# Patient Record
Sex: Male | Born: 1995 | Hispanic: Yes | Marital: Single | Smoking: Current some day smoker
Health system: Southern US, Community
[De-identification: ages and names within clinical notes are randomized; demographics above are authoritative.]

---
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
## 2016-05-30 ENCOUNTER — Emergency Department
Admission: EM | Admit: 2016-05-30 | Discharge: 2016-05-30 | Disposition: A | Discharge status: Home / Self Care | Payer: Self-pay | Attending: Emergency Medicine | Admitting: Emergency Medicine

## 2016-05-30 ENCOUNTER — Encounter: Payer: Self-pay | Admitting: *Deleted

## 2016-05-30 DIAGNOSIS — F17200 Nicotine dependence, unspecified, uncomplicated: Secondary | ICD-10-CM | POA: Insufficient documentation

## 2016-05-30 DIAGNOSIS — K047 Periapical abscess without sinus: Secondary | ICD-10-CM | POA: Insufficient documentation

## 2016-05-30 MED ORDER — NAPROXEN 500 MG PO TABS: 500.0000 mg | ORAL_TABLET | Freq: Two times a day (BID) | ORAL | 0 refills | Status: DC

## 2016-05-30 MED ORDER — HYDROCODONE-ACETAMINOPHEN 5-325 MG PO TABS: 1.0000 | ORAL_TABLET | ORAL | 0 refills | Status: DC | PRN

## 2016-05-30 MED ORDER — AMOXICILLIN 500 MG PO TABS: 500.0000 mg | ORAL_TABLET | Freq: Two times a day (BID) | ORAL | 0 refills | Status: DC

## 2016-05-30 NOTE — ED Notes (Signed)
NAD noted at time of D/C. Pt denies questions or concerns. Pt ambulatory to the lobby at this time.  

## 2016-05-30 NOTE — ED Provider Notes (Signed)
[facility] Emergency Department Provider Note ____________________________________________  Time seen: Approximately 4:26 PM  I have reviewed the triage vital signs and the nursing notes.   HISTORY  Chief Complaint Dental Pain   HPI Willie Lyons is a 20 y.o. male resents to the emergency department for evaluation of dental pain. Pain started yesterday and swelling happened overnight. Swelling has continued to increase throughout the day. He has taken ibuprofen without relief.  History reviewed. No pertinent past medical history.  There are no active problems to display for this patient.   History reviewed. No pertinent surgical history.  Prior to Admission medications   Medication Sig Start Date End Date Taking? Authorizing Provider  amoxicillin (AMOXIL) 500 MG tablet Take 1 tablet (500 mg total) by mouth 2 (two) times daily. 05/30/16   Chinita Pester, FNP  HYDROcodone-acetaminophen (NORCO/VICODIN) 5-325 MG tablet Take 1 tablet by mouth every 4 (four) hours as needed for moderate pain. 05/30/16 05/30/17  Chinita Pester, FNP  naproxen (NAPROSYN) 500 MG tablet Take 1 tablet (500 mg total) by mouth 2 (two) times daily with a meal. 05/30/16   Chinita Pester, FNP    Allergies Review of patient's allergies indicates no known allergies.  No family history on file.  Social History Social History  Substance Use Topics  . Smoking status: Current Some Day Smoker  . Smokeless tobacco: Not on file  . Alcohol use Yes    Review of Systems Constitutional: Well appearing. Negative for fever ENT: Negative for eye pain, negative for rhinorrhea or congestion, negative for sore throat. Musculoskeletal: Negative for jaw pain/trismus. Skin: Positive for her mild edema, negative for erythema ____________________________________________   PHYSICAL EXAM:  VITAL SIGNS: ED Triage Vitals  Enc Vitals Group     BP 05/30/16 1609 124/81     Pulse Rate 05/30/16 1609  82     Resp 05/30/16 1609 18     Temp 05/30/16 1609 98.3 F (36.8 C)     Temp Source 05/30/16 1609 Oral     SpO2 05/30/16 1609 98 %     Weight 05/30/16 1608 140 lb (63.5 kg)     Height 05/30/16 1608 6\' 2"  (1.88 m)     Head Circumference --      Peak Flow --      Pain Score 05/30/16 1609 10     Pain Loc --      Pain Edu? --      Excl. in GC? --     Constitutional: Alert and oriented. Well appearing and in no acute distress. Eyes: Conjunctivae are normal.EOMI. Mouth/Throat: Mucous membranes are moist. Oropharynx non-erythematous. Mild edema of the right mandible without fluctuance. Periodontal Exam    Hematological/Lymphatic/Immunilogical: No cervical lymphadenopathy. Respiratory: Normal respiratory effort.  Musculoskeletal: Full ROM x 4 extremities. Neurologic:  Normal speech and language. No gross focal neurologic deficits are appreciated. Speech is normal. No gait instability. Skin:  Negative for erythema Psychiatric: Mood and affect are normal. Speech and behavior are normal.  ____________________________________________   LABS (all labs ordered are listed, but only abnormal results are displayed)  Labs Reviewed - No data to display ____________________________________________   RADIOLOGY  Not indicated ____________________________________________   PROCEDURES  Procedure(s) performed: None  Critical Care performed: No  ____________________________________________   INITIAL IMPRESSION / ASSESSMENT AND PLAN / ED COURSE  Pertinent labs & imaging results that were available during my care of the patient were reviewed by me and considered in my medical decision making (  see chart for details). Patient will be prescribed amoxicillin, Naprosyn, and 3 tablets of Norco 5/325. Patient was advised to see the dentist within 14 days. Also advised to take the antibiotic until finished. Instructed to return to the ER for symptoms that change or worsen if unable to schedule  an appointment. ____________________________________________   FINAL CLINICAL IMPRESSION(S) / ED DIAGNOSES  Final diagnoses:  Dental abscess    Note:  This document was prepared using Dragon voice recognition software and may include unintentional dictation errors.    Chinita PesterCari B Guled Gahan, FNP 05/30/16 1754    Sharyn CreamerMark Quale, MD 06/05/16 [phone]

## 2016-05-30 NOTE — ED Triage Notes (Signed)
Pt complains of dental pain starting last night

## 2016-05-30 NOTE — Discharge Instructions (Signed)
Please call and schedule a dental appointment as soon as possible. You will need to be seen within the next 14 days. Return to the emergency department for symptoms that change or worsen if you're unable to schedule an appointment.  OPTIONS FOR DENTAL FOLLOW UP CARE  [place] Department of Health and Human Services - Local Safety Net Dental Clinics http://www.ncdhhs.gov/dph/oralhealth/services/safetynetclinics.htm   [facility] ([phone])  [facility] ([phone])  [facility] ([phone])  [facility] ([phone])  [facility] ([phone]) This clinic caters to the indigent population and is on a lottery system. Location: [facility], [address], [place] Clinic Hours: Wednesdays from 6pm - 9pm, patients seen by a lottery system. For dates, call or go to [email or web address].[facility].edu/[facility]/patients/[email or web address]-[facility] Services: Cleanings, fillings and simple extractions. Payment Options: DENTAL WORK IS FREE OF CHARGE. Bring proof of income or support. Best way to get seen: Arrive at 5:15 pm - this is a lottery, NOT first come/first serve, so arriving earlier will not increase your chances of being seen.     [facility] [phone] Select option 1 for emergencies   Location: [facility], [address], [place] Clinic Hours: No walk-ins accepted - call the day before to schedule an appointment. Check in times are 9:30 am and 1:30 pm. Services: Simple extractions, temporary fillings, pulpectomy/pulp debridement, uncomplicated abscess drainage. Payment Options: PAYMENT IS DUE AT THE TIME OF SERVICE.  Fee is usually $100-200, additional surgical procedures (e.g. abscess drainage) may be extra. Cash, checks, Visa/MasterCard accepted.  Can file Medicaid if patient is covered for dental - patient should call case worker to check. No  discount for [facility] patients. Best way to get seen: MUST call the day before and get onto the schedule. Can usually be seen the next 1-2 days. No walk-ins accepted.     [facility] [phone]   Location: [facility], [address], [place] [facility] Hours: M, W, Th, F 8am or 1:30pm, Tues 9a or 1:30 - first come/first served. Services: Simple extractions, temporary fillings, uncomplicated abscess drainage.  You do not need to be an [place] resident. Payment Options: PAYMENT IS DUE AT THE TIME OF SERVICE. Dental insurance, otherwise sliding scale - bring proof of income or support. Depending on income and treatment needed, cost is usually $50-200. Best way to get seen: Arrive early as it is first come/first served.     [facility] [phone]   Location: [address] Clinic Hours: Mon-Thu 8a-5p Services: Most basic dental services including extractions and fillings. Payment Options: PAYMENT IS DUE AT THE TIME OF SERVICE. Sliding scale, up to 50% off - bring proof if income or support. Medicaid with dental option accepted. Best way to get seen: Call to schedule an appointment, can usually be seen within 2 weeks OR they will try to see walk-ins - show up at 8a or 2p (you may have to wait).     [facility] [phone] [place] RESIDENTS ONLY   Location: [facility], [address], [place], [place] [address] Clinic Hours: By appointment only. Monday - Thursday 8am-5pm, Friday 8am-12pm Services: Cleanings, fillings, extractions. Payment Options: PAYMENT IS DUE AT THE TIME OF SERVICE. Cash, Visa or MasterCard. Sliding scale - $30 minimum per service. Best way to get seen: Come in to office, complete packet and make an appointment -   need proof of income or support monies for each household member and proof of [place]  residence. Usually takes about a month to get in.     [facility] [phone]   Location: [address] [place] Clinic Hours: Walk-in Urgent Care Dental Services are offered Monday-Friday mornings only. The numbers of emergencies accepted daily is limited to the number of providers available. Maximum 15 - Mondays, Wednesdays & Thursdays Maximum 10 - Tuesdays & Fridays Services: You do not need to be a [place] resident to be seen for a dental emergency. Emergencies are defined as pain, swelling, abnormal bleeding, or dental trauma. Walkins will receive x-rays if needed. NOTE: Dental cleaning is not an emergency. Payment Options: PAYMENT IS DUE AT THE TIME OF SERVICE. Minimum co-pay is $40.00 for uninsured patients. Minimum co-pay is $3.00 for Medicaid with dental coverage. Dental Insurance is accepted and must be presented at time of visit. Medicare does not cover dental. Forms of payment: Cash, credit card, checks. Best way to get seen: If not previously registered with the clinic, walk-in dental registration begins at 7:15 am and is on a first come/first serve basis. If previously registered with the clinic, call to make an appointment.     [facility] [phone] [place] RESIDENTS ONLY   Location: [address], [place], [place] Clinic Hours: Mon-Thu 10a-2p Services: Extractions only! Payment Options: FREE (donations accepted) - bring proof of income or support Best way to get seen: Call and schedule an appointment OR come at 8am on the 1st Monday of every month (except for holidays) when it is first come/first served.     [facility] [phone]   Location: [address], [place] Clinic Hours: Friday mornings Services, Payment Options, Best way to get seen: Call for info  

## 2016-07-22 ENCOUNTER — Encounter: Payer: Self-pay | Admitting: Emergency Medicine

## 2016-07-22 ENCOUNTER — Emergency Department
Admission: EM | Admit: 2016-07-22 | Discharge: 2016-07-22 | Disposition: A | Discharge status: Home / Self Care | Payer: Self-pay | Attending: Student in an Organized Health Care Education/Training Program | Admitting: Student in an Organized Health Care Education/Training Program

## 2016-07-22 DIAGNOSIS — Y929 Unspecified place or not applicable: Secondary | ICD-10-CM | POA: Insufficient documentation

## 2016-07-22 DIAGNOSIS — F17200 Nicotine dependence, unspecified, uncomplicated: Secondary | ICD-10-CM | POA: Insufficient documentation

## 2016-07-22 DIAGNOSIS — Y990 Civilian activity done for income or pay: Secondary | ICD-10-CM | POA: Insufficient documentation

## 2016-07-22 DIAGNOSIS — S29011A Strain of muscle and tendon of front wall of thorax, initial encounter: Secondary | ICD-10-CM | POA: Insufficient documentation

## 2016-07-22 DIAGNOSIS — X500XXA Overexertion from strenuous movement or load, initial encounter: Secondary | ICD-10-CM | POA: Insufficient documentation

## 2016-07-22 DIAGNOSIS — Y9389 Activity, other specified: Secondary | ICD-10-CM | POA: Insufficient documentation

## 2016-07-22 MED ORDER — CYCLOBENZAPRINE HCL 10 MG PO TABS: 10.0000 mg | ORAL_TABLET | Freq: Three times a day (TID) | ORAL | 0 refills | Status: DC | PRN

## 2016-07-22 MED ORDER — NAPROXEN 500 MG PO TABS: 500.0000 mg | ORAL_TABLET | Freq: Two times a day (BID) | ORAL | 0 refills | Status: DC

## 2016-07-22 NOTE — ED Triage Notes (Signed)
Patient says he pulled a muscle in shoulder last week and it is no better.

## 2016-07-22 NOTE — ED Provider Notes (Signed)
[facility] Emergency Department Provider Note  ____________________________________________  Time seen: Approximately 12:28 PM  I have reviewed the triage vital signs and the nursing notes.   HISTORY  Chief Complaint Arm Injury    HPI Willie Lyons is a 20 y.o. male , NAD, presents to the emergency department with 10 day history of left shoulder and chest pain. States he believes he pulled a muscle while at work. States he is in [employer] and completes a lot of heavy lifting, overhead work and repetitive pulling and pushing. States after were possibly one and half weeks ago he had some pain in his left upper chest wall. States the area is tender to touch. Denies any trauma to the area. Has not noted any redness, swelling, bruising or skin sores. Denies any cardiac chest pain or pressure. Has had no shortness of breath, dyspnea on exertion, numbness, wheeze, tingling, headaches, diaphoresis, numbness, weakness or tingling. Not taking anything over-the-counter for his symptoms at this time.   History reviewed. No pertinent past medical history.  There are no active problems to display for this patient.   History reviewed. No pertinent surgical history.  Prior to Admission medications   Medication Sig Start Date End Date Taking? Authorizing Provider  amoxicillin (AMOXIL) 500 MG tablet Take 1 tablet (500 mg total) by mouth 2 (two) times daily. 05/30/16   Chinita Pesterari B Triplett, FNP  cyclobenzaprine (FLEXERIL) 10 MG tablet Take 1 tablet (10 mg total) by mouth 3 (three) times daily as needed for muscle spasms. 07/22/16   Sanyla Summey L Gini Caputo, PA-C  HYDROcodone-acetaminophen (NORCO/VICODIN) 5-325 MG tablet Take 1 tablet by mouth every 4 (four) hours as needed for moderate pain. 05/30/16 05/30/17  Chinita Pesterari B Triplett, FNP  naproxen (NAPROSYN) 500 MG tablet Take 1 tablet (500 mg total) by mouth 2 (two) times daily with a meal. 07/22/16   Toia Micale L Leland Raver, PA-C    Allergies Patient has  no known allergies.  No family history on file.  Social History Social History  Substance Use Topics  . Smoking status: Current Some Day Smoker  . Smokeless tobacco: Never Used  . Alcohol use Yes     Review of Systems  Constitutional: No fever/chills Eyes: No visual changes.  Cardiovascular: No chest pain. Respiratory:  No shortness of breath. No wheezing.  Gastrointestinal: No abdominal pain.  No nausea, vomiting.   Musculoskeletal: Positive left chest wall pain. Negative for back, neck pain.  Skin: Negative for rash, redness, swelling, bruising, skin sores. Neurological: Negative for numbness, weakness, tingling. 10-point ROS otherwise negative.  ____________________________________________   PHYSICAL EXAM:  VITAL SIGNS: ED Triage Vitals [07/22/16 1102]  Enc Vitals Group     BP 120/71     Pulse Rate 65     Resp 14     Temp 97.1 F (36.2 C)     Temp Source Oral     SpO2 99 %     Weight 145 lb (65.8 kg)     Height 6\' 2"  (1.88 m)     Head Circumference      Peak Flow      Pain Score 8     Pain Loc      Pain Edu?      Excl. in GC?      Constitutional: Alert and oriented. Well appearing and in no acute distress. Eyes: Conjunctivae are normal.  Head: Atraumatic. Neck: Supple with full motion Hematological/Lymphatic/Immunilogical: No cervical lymphadenopathy. Cardiovascular: Normal rate, regular rhythm. Normal S1 and S2.  No murmurs, rubs, gallops. Good peripheral circulation. Respiratory: Normal respiratory effort without tachypnea or retractions. Lungs CTAB with breath sounds noted in all lung fields. No wheeze, rhonchi, rales. Musculoskeletal: Range of motion of bilateral upper extremities without pain or difficulty. Tenderness to palpation about the left upper chest wall without bony abnormalities or crepitus. No tenderness to palpation about the sternum or clavicle. No AC joint tenderness or disruption. Strength in bilateral upper extremities is 5 out of 5 and  equal. Neurologic:  Normal speech and language. No gross focal neurologic deficits are appreciated. Sensation to light touch grossly intact about bilateral upper extremities. Skin:  Skin is warm, dry and intact. No rash, redness, swelling, bruising, skin sores or open wounds noted. Psychiatric: Mood and affect are normal. Speech and behavior are normal. Patient exhibits appropriate insight and judgement.   ____________________________________________   LABS  None ____________________________________________  EKG  None ____________________________________________  RADIOLOGY  None ____________________________________________    PROCEDURES  Procedure(s) performed: None   Procedures   Medications - No data to display   ____________________________________________   INITIAL IMPRESSION / ASSESSMENT AND PLAN / ED COURSE  Pertinent labs & imaging results that were available during my care of the patient were reviewed by me and considered in my medical decision making (see chart for details).  Clinical Course     Patient's diagnosis is consistent with Chest wall muscle strain. Patient will be discharged home with prescriptions for Flexeril and Naprosyn to take as directed. Patient is to apply warm heat to the affected area 20 minutes 3-4 times daily as needed. Patient is to follow up with Dr. Joice LoftsPoggi in orthopedics if symptoms persist past this treatment course. Patient is given ED precautions to return to the ED for any worsening or new symptoms.    ____________________________________________  FINAL CLINICAL IMPRESSION(S) / ED DIAGNOSES  Final diagnoses:  Chest wall muscle strain, initial encounter      NEW MEDICATIONS STARTED DURING THIS VISIT:  Discharge Medication List as of 07/22/2016 12:30 PM    START taking these medications   Details  cyclobenzaprine (FLEXERIL) 10 MG tablet Take 1 tablet (10 mg total) by mouth 3 (three) times daily as needed for muscle  spasms., Starting Thu 07/22/2016, Print             Hope PigeonJami L Neytiri Asche, PA-C 07/22/16 1628    Willy EddyPatrick Robinson, MD 07/23/16 1208

## 2016-08-02 ENCOUNTER — Emergency Department: Payer: Self-pay

## 2016-08-02 ENCOUNTER — Encounter: Payer: Self-pay | Admitting: Emergency Medicine

## 2016-08-02 ENCOUNTER — Emergency Department
Admission: EM | Admit: 2016-08-02 | Discharge: 2016-08-02 | Disposition: A | Discharge status: Home / Self Care | Payer: Self-pay | Attending: Emergency Medicine | Admitting: Emergency Medicine

## 2016-08-02 DIAGNOSIS — R197 Diarrhea, unspecified: Secondary | ICD-10-CM | POA: Insufficient documentation

## 2016-08-02 DIAGNOSIS — M791 Myalgia, unspecified site: Secondary | ICD-10-CM

## 2016-08-02 DIAGNOSIS — R079 Chest pain, unspecified: Secondary | ICD-10-CM | POA: Insufficient documentation

## 2016-08-02 DIAGNOSIS — F17200 Nicotine dependence, unspecified, uncomplicated: Secondary | ICD-10-CM | POA: Insufficient documentation

## 2016-08-02 DIAGNOSIS — R1110 Vomiting, unspecified: Secondary | ICD-10-CM

## 2016-08-02 DIAGNOSIS — Z79899 Other long term (current) drug therapy: Secondary | ICD-10-CM | POA: Insufficient documentation

## 2016-08-02 LAB — COMPREHENSIVE METABOLIC PANEL
ALBUMIN: 4.7 g/dL (ref 3.5–5.0)
ALK PHOS: 78 U/L (ref 38–126)
ALT: 17 U/L (ref 17–63)
ANION GAP: 6 (ref 5–15)
AST: 20 U/L (ref 15–41)
BILIRUBIN TOTAL: 0.8 mg/dL (ref 0.3–1.2)
BUN: 20 mg/dL (ref 6–20)
CALCIUM: 9.6 mg/dL (ref 8.9–10.3)
CO2: 32 mmol/L (ref 22–32)
Chloride: 102 mmol/L (ref 101–111)
Creatinine, Ser: 0.99 mg/dL (ref 0.61–1.24)
GLUCOSE: 111 mg/dL — AB (ref 65–99)
Potassium: 3.9 mmol/L (ref 3.5–5.1)
Sodium: 140 mmol/L (ref 135–145)
TOTAL PROTEIN: 8.2 g/dL — AB (ref 6.5–8.1)

## 2016-08-02 LAB — INFLUENZA PANEL BY PCR (TYPE A & B)
INFLAPCR: NEGATIVE
Influenza B By PCR: NEGATIVE

## 2016-08-02 LAB — LIPASE, BLOOD: Lipase: 18 U/L (ref 11–51)

## 2016-08-02 LAB — CBC
HCT: 46.9 % (ref 40.0–52.0)
HEMOGLOBIN: 16.4 g/dL (ref 13.0–18.0)
MCH: 33.1 pg (ref 26.0–34.0)
MCHC: 34.9 g/dL (ref 32.0–36.0)
MCV: 94.7 fL (ref 80.0–100.0)
Platelets: 302 10*3/uL (ref 150–440)
RBC: 4.95 MIL/uL (ref 4.40–5.90)
RDW: 12.9 % (ref 11.5–14.5)
WBC: 9.9 10*3/uL (ref 3.8–10.6)

## 2016-08-02 LAB — MONONUCLEOSIS SCREEN: MONO SCREEN: NEGATIVE

## 2016-08-02 MED ORDER — ETODOLAC 200 MG PO CAPS: 200.0000 mg | ORAL_CAPSULE | Freq: Three times a day (TID) | ORAL | 0 refills | Status: DC

## 2016-08-02 MED ORDER — KETOROLAC TROMETHAMINE 30 MG/ML IJ SOLN
30.0000 mg | Freq: Once | INTRAMUSCULAR | Status: AC
  Administered 2016-08-02: 30 mg via INTRAVENOUS
  Filled 2016-08-02: qty 1

## 2016-08-02 MED ORDER — ONDANSETRON 4 MG PO TBDP
4.0000 mg | ORAL_TABLET | Freq: Once | ORAL | Status: AC | PRN
  Administered 2016-08-02: 4 mg via ORAL

## 2016-08-02 MED ORDER — ONDANSETRON 4 MG PO TBDP: 4.0000 mg | ORAL_TABLET | Freq: Three times a day (TID) | ORAL | 0 refills | Status: DC | PRN

## 2016-08-02 MED ORDER — ONDANSETRON 4 MG PO TBDP
ORAL_TABLET | ORAL | Status: AC
  Filled 2016-08-02: qty 1

## 2016-08-02 MED ORDER — SODIUM CHLORIDE 0.9 % IV BOLUS (SEPSIS)
1000.0000 mL | Freq: Once | INTRAVENOUS | Status: AC
  Administered 2016-08-02: 1000 mL via INTRAVENOUS

## 2016-08-02 NOTE — ED Triage Notes (Signed)
Pt c/o pain to the left side of his chest for several hours; N/V; generalized body aches; pt seen here about 1 week ago with pulled muscle in his chest;

## 2016-08-02 NOTE — ED Notes (Signed)
Pt states has had left sided chest and abdominal pain upper and lower quadrant with chills and cough for "days". Pt with normal color warm and dry skin. Pt states he has had vomiting, unknown amount.

## 2016-08-02 NOTE — ED Provider Notes (Signed)
[facility] Emergency Department Provider Note   ____________________________________________   First MD Initiated Contact with Patient 08/02/16 [phone]     (approximate)  I have reviewed the triage vital signs and the nursing notes.   HISTORY  Chief Complaint Generalized Body Aches; Emesis; and Chest Pain    HPI Willie Lyons is a 20 y.o. male who comes into the hospital today with upper body pain. The patient reports he couldn't sleep. He reports that whenever he moved it would hurt. The patient also reports that he had some vomiting. He took some muscle relaxers and it didn't help his symptoms. The symptoms started around 1 AM. The patient felt hot but did not take his temperature. He also vomited 4 times this morning. He had some diarrhea with some pain all over his abdomen. The patient had a headache but it is currently improved. He said he felt weak and his mother and girlfriend are both sick. The patient denies any cough or runny nose and he rates his pain 8 out of 10 in intensity.   History reviewed. No pertinent past medical history.  There are no active problems to display for this patient.   History reviewed. No pertinent surgical history.  Prior to Admission medications   Medication Sig Start Date End Date Taking? Authorizing Provider  cyclobenzaprine (FLEXERIL) 10 MG tablet Take 1 tablet (10 mg total) by mouth 3 (three) times daily as needed for muscle spasms. 07/22/16  Yes Jami L Hagler, PA-C  naproxen (NAPROSYN) 500 MG tablet Take 1 tablet (500 mg total) by mouth 2 (two) times daily with a meal. 07/22/16  Yes Jami L Hagler, PA-C  etodolac (LODINE) 200 MG capsule Take 1 capsule (200 mg total) by mouth every 8 (eight) hours. 08/02/16   Rebecka ApleyAllison P Finn Amos, MD  ondansetron (ZOFRAN ODT) 4 MG disintegrating tablet Take 1 tablet (4 mg total) by mouth every 8 (eight) hours as needed for nausea or vomiting. 08/02/16   Rebecka ApleyAllison P Archimedes Harold, MD     Allergies Patient has no known allergies.  History reviewed. No pertinent family history.  Social History Social History  Substance Use Topics  . Smoking status: Current Some Day Smoker  . Smokeless tobacco: Never Used  . Alcohol use Yes    Review of Systems Constitutional: No fever/chills Eyes: No visual changes. ENT: No sore throat. Cardiovascular:  chest pain. Respiratory: Denies shortness of breath. Gastrointestinal: No abdominal pain.  No nausea, no vomiting.  No diarrhea.  No constipation. Genitourinary: Negative for dysuria. Musculoskeletal: body aches Skin: Negative for rash. Neurological: headache  10-point ROS otherwise negative.  ____________________________________________   PHYSICAL EXAM:  VITAL SIGNS: ED Triage Vitals  Enc Vitals Group     BP 08/02/16 0422 117/75     Pulse Rate 08/02/16 0422 99     Resp 08/02/16 0422 18     Temp 08/02/16 0422 99 F (37.2 C)     Temp Source 08/02/16 0422 Oral     SpO2 08/02/16 0422 97 %     Weight 08/02/16 0419 145 lb (65.8 kg)     Height 08/02/16 0419 6\' 2"  (1.88 m)     Head Circumference --      Peak Flow --      Pain Score --      Pain Loc --      Pain Edu? --      Excl. in GC? --     Constitutional: Alert and oriented. Well appearing and  in mild distress. Eyes: Conjunctivae are normal. PERRL. EOMI. Head: Atraumatic. Nose: No congestion/rhinnorhea. Mouth/Throat: Mucous membranes are moist.  Oropharynx non-erythematous. Cardiovascular: Normal rate, regular rhythm. Grossly normal heart sounds.  Good peripheral circulation. Respiratory: Normal respiratory effort.  No retractions. Lungs CTAB. Gastrointestinal: Soft and nontender. No distention. Positive bowel sounds Musculoskeletal: No lower extremity tenderness nor edema.   Neurologic:  Normal speech and language.  Skin:  Skin is warm, dry and intact. Psychiatric: Mood and affect are normal.   ____________________________________________    LABS (all labs ordered are listed, but only abnormal results are displayed)  Labs Reviewed  COMPREHENSIVE METABOLIC PANEL - Abnormal; Notable for the following:       Result Value   Glucose, Bld 111 (*)    Total Protein 8.2 (*)    All other components within normal limits  LIPASE, BLOOD  CBC  MONONUCLEOSIS SCREEN  INFLUENZA PANEL BY PCR (TYPE A & B, H1N1)   ____________________________________________  EKG  ED ECG REPORT I, Rebecka ApleyWebster,  Bilan Tedesco P, the attending physician, personally viewed and interpreted this ECG.   Date: 08/02/2016  EKG Time: 421  Rate: 99  Rhythm: normal sinus rhythm  Axis: normal  Intervals:none  ST&T Change: none  ____________________________________________  RADIOLOGY  CXR ____________________________________________   PROCEDURES  Procedure(s) performed: None  Procedures  Critical Care performed: No  ____________________________________________   INITIAL IMPRESSION / ASSESSMENT AND PLAN / ED COURSE  Pertinent labs & imaging results that were available during my care of the patient were reviewed by me and considered in my medical decision making (see chart for details).  This is a 20 year old male who comes in with some body aches, nausea, vomiting. The patient's blood work is unremarkable I will also send the patient's blood work with a monotone and check the patient for flu. The patient did receive some Zofran and I'll give him a shot of Toradol for pain. The patient be reassessed once I received the results of his blood work.  Clinical Course as of Aug 03 735  Mon Aug 02, 2016  [id number] No active cardiopulmonary disease. DG Chest 2 View [AW]    Clinical Course User Index [AW] Rebecka ApleyAllison P Azahel Belcastro, MD   The patient's blood work is unremarkable. His flu and mono are both negative. He states that he does feel little bit better. I feel that he may be having a viral syndrome from the vomiting and diarrhea which she has had. He'll be  discharged home to follow-up with the acute care clinic or return with any worsening symptoms. She did not have any abdominal pain on exam.  ____________________________________________   FINAL CLINICAL IMPRESSION(S) / ED DIAGNOSES  Final diagnoses:  Myalgia  Vomiting and diarrhea      NEW MEDICATIONS STARTED DURING THIS VISIT:  New Prescriptions   ETODOLAC (LODINE) 200 MG CAPSULE    Take 1 capsule (200 mg total) by mouth every 8 (eight) hours.   ONDANSETRON (ZOFRAN ODT) 4 MG DISINTEGRATING TABLET    Take 1 tablet (4 mg total) by mouth every 8 (eight) hours as needed for nausea or vomiting.     Note:  This document was prepared using Dragon voice recognition software and may include unintentional dictation errors.    Rebecka ApleyAllison P Michaeljames Milnes, MD 08/02/16 [phone]

## 2021-06-18 ENCOUNTER — Emergency Department
Admission: EM | Admit: 2021-06-18 | Discharge: 2021-06-19 | Discharge status: Against Medical Advice | Payer: Self-pay | Attending: Emergency Medicine | Admitting: Emergency Medicine

## 2021-06-18 ENCOUNTER — Other Ambulatory Visit: Payer: Self-pay

## 2021-06-18 DIAGNOSIS — W260XXA Contact with knife, initial encounter: Secondary | ICD-10-CM | POA: Insufficient documentation

## 2021-06-18 DIAGNOSIS — S61211A Laceration without foreign body of left index finger without damage to nail, initial encounter: Secondary | ICD-10-CM | POA: Insufficient documentation

## 2021-06-18 DIAGNOSIS — F17200 Nicotine dependence, unspecified, uncomplicated: Secondary | ICD-10-CM | POA: Insufficient documentation

## 2021-06-18 DIAGNOSIS — Y93G9 Activity, other involving cooking and grilling: Secondary | ICD-10-CM | POA: Insufficient documentation

## 2021-06-18 DIAGNOSIS — Z23 Encounter for immunization: Secondary | ICD-10-CM | POA: Insufficient documentation

## 2021-06-18 MED ORDER — TETANUS-DIPHTH-ACELL PERTUSSIS 5-2.5-18.5 LF-MCG/0.5 IM SUSY
0.5000 mL | PREFILLED_SYRINGE | Freq: Once | INTRAMUSCULAR | Status: AC
  Administered 2021-06-18: 0.5 mL via INTRAMUSCULAR
  Filled 2021-06-18: qty 0.5

## 2021-06-18 MED ORDER — LIDOCAINE-EPINEPHRINE 2 %-1:100000 IJ SOLN
20.0000 mL | Freq: Once | INTRAMUSCULAR | Status: AC
  Administered 2021-06-18: 20 mL
  Filled 2021-06-18: qty 1

## 2021-06-18 MED ORDER — OXYCODONE HCL 5 MG PO TABS
5.0000 mg | ORAL_TABLET | Freq: Once | ORAL | Status: AC
  Administered 2021-06-18: 5 mg via ORAL
  Filled 2021-06-18: qty 1

## 2021-06-18 MED ORDER — PHENTOLAMINE MESYLATE 5 MG IJ SOLR
5.0000 mg | Freq: Once | INTRAMUSCULAR | Status: DC
  Filled 2021-06-18: qty 5

## 2021-06-18 NOTE — ED Notes (Signed)
Phentolamine reconstituted with 1 ml sterile water and added to 9 ml's NS per pharmacist instruction. Phentolamine given to MD [facility].

## 2021-06-18 NOTE — ED Notes (Signed)
Per MD pt not in room upon entry, pt not found in bathroom. Pt possibly eloped despite MD's verbal instructions to stay for reversal medication. Oncoming RN notified.

## 2021-06-18 NOTE — ED Provider Notes (Addendum)
[facility] Emergency Department Provider Note  ____________________________________________   Event Date/Time   First MD Initiated Contact with Patient 06/18/21 2018     (approximate)  I have reviewed the triage vital signs and the nursing notes.   HISTORY  Chief Complaint Laceration    HPI Willie Lyons is a 25 y.o. male who reports being otherwise healthy comes in with a injury to his finger.  Patient reports that he was trying to cut something open and he cut the tip of his second finger.  Denies any crush injury.  Patient reports a ton of bleeding from it and pain that is severe, constant, nothing makes it better or worse   Medical: None There are no problems to display for this patient.   No past surgical history on file.  Prior to Admission medications   Medication Sig Start Date End Date Taking? Authorizing Provider  cyclobenzaprine (FLEXERIL) 10 MG tablet Take 1 tablet (10 mg total) by mouth 3 (three) times daily as needed for muscle spasms. 07/22/16   Hagler, Jami L, PA-C  etodolac (LODINE) 200 MG capsule Take 1 capsule (200 mg total) by mouth every 8 (eight) hours. 08/02/16   Rebecka Apley, MD  naproxen (NAPROSYN) 500 MG tablet Take 1 tablet (500 mg total) by mouth 2 (two) times daily with a meal. 07/22/16   Hagler, Jami L, PA-C  ondansetron (ZOFRAN ODT) 4 MG disintegrating tablet Take 1 tablet (4 mg total) by mouth every 8 (eight) hours as needed for nausea or vomiting. 08/02/16   Rebecka Apley, MD    Allergies Patient has no known allergies.  No family history on file.  Social History Social History   Tobacco Use   Smoking status: Some Days   Smokeless tobacco: Never  Substance Use Topics   Alcohol use: Yes      Review of Systems Constitutional: No fever/chills Eyes: No visual changes. ENT: No sore throat. Cardiovascular: Denies chest pain. Respiratory: Denies shortness of breath. Gastrointestinal: No  abdominal pain.  No nausea, no vomiting.  No diarrhea.  No constipation. Genitourinary: Negative for dysuria. Musculoskeletal: Negative for back pain.  Finger injury Skin: Negative for rash. Neurological: Negative for headaches, focal weakness or numbness. All other ROS negative ____________________________________________   PHYSICAL EXAM:  VITAL SIGNS: ED Triage Vitals  Enc Vitals Group     BP 06/18/21 1947 111/76     Pulse Rate 06/18/21 1947 74     Resp 06/18/21 1947 20     Temp 06/18/21 1947 98.4 F (36.9 C)     Temp Source 06/18/21 1947 Oral     SpO2 06/18/21 1947 99 %     Weight 06/18/21 1948 193 lb (87.5 kg)     Height 06/18/21 1948 6\' 1"  (1.854 m)     Head Circumference --      Peak Flow --      Pain Score 06/18/21 1948 10     Pain Loc --      Pain Edu? --      Excl. in GC? --     Constitutional: Alert and oriented. Well appearing and in no acute distress. Eyes: Conjunctivae are normal. EOMI. Head: Atraumatic. Nose: No congestion/rhinnorhea. Mouth/Throat: Mucous membranes are moist.   Neck: No stridor. Trachea Midline. FROM Cardiovascular: Normal rate, regular rhythm. Grossly normal heart sounds.  Good peripheral circulation. Respiratory: Normal respiratory effort.  No retractions. Lungs CTAB. Gastrointestinal: Soft and nontender. No distention. No abdominal bruits.  Musculoskeletal:  Patient with laceration to the fat pad of the second finger.  No involvement of the nail or nailbed.  No subungual hematoma.  DIP and PIP flexion extension is normal.  Lots of venous bleeding noted. Tinged discoloration noted at edge of laceration. Sensation intact Neurologic:  Normal speech and language. No gross focal neurologic deficits are appreciated.  Skin:  Skin is warm, dry and intact. No rash noted. Psychiatric: Mood and affect are normal. Speech and behavior are normal. GU: Deferred   ___PROCEDURES  Procedure(s) performed (including Critical Care):  Marland KitchenMarland KitchenLaceration  Repair  Date/Time: 06/18/2021 11:47 PM Performed by: Concha Se, MD Authorized by: Concha Se, MD   Consent:    Consent obtained:  Verbal   Consent given by:  Patient   Risks discussed:  Infection, pain, retained foreign body, tendon damage, vascular damage, poor wound healing, poor cosmetic result, need for additional repair and nerve damage   Alternatives discussed:  No treatment Universal protocol:    Patient identity confirmed:  Verbally with patient Anesthesia:    Anesthesia method:  Local infiltration   Local anesthetic:  Lidocaine 1% WITH epi Laceration details:    Location:  Finger   Finger location:  L index finger   Length (cm):  5   Depth (mm):  1 Pre-procedure details:    Preparation:  Patient was prepped and draped in usual sterile fashion Exploration:    Limited defect created (wound extended): no     Imaging outcome: foreign body not noted     Wound exploration: entire depth of wound visualized     Contaminated: no   Treatment:    Area cleansed with:  Saline   Amount of cleaning:  Standard   Irrigation solution:  Sterile saline   Irrigation volume:  500   Visualized foreign bodies/material removed: no   Skin repair:    Repair method:  Sutures   Suture size:  6-0   Suture material:  Prolene   Number of sutures:  5 Approximation:    Approximation:  Close Repair type:    Repair type:  Simple Post-procedure details:    Dressing:  Open (no dressing)   Procedure completion:  Tolerated   ____________________________________________   INITIAL IMPRESSION / ASSESSMENT AND PLAN / ED COURSE  Willie Lyons was evaluated in Emergency Department on 06/18/2021 for the symptoms described in the history of present illness. He was evaluated in the context of the global COVID-19 pandemic, which necessitated consideration that the patient might be at risk for infection with the SARS-CoV-2 virus that causes COVID-19. Institutional protocols and algorithms that  pertain to the evaluation of patients at risk for COVID-19 are in a state of rapid change based on information released by regulatory bodies including the CDC and federal and state organizations. These policies and algorithms were followed during the patient's care in the ED.     Patient comes in with finger injury.  Patient states that it was a slice injury.No Need to get a x-ray given it is superficially into the fat of the finger pad.  No evidence of neurovascular issues.  A little bit of tinge discoloration noted at the laceration edges.  Given patient had a lot of venous bleeding I did a ring digital block and I used epinephrine around 3-4 cc total injected. I explained to pt giving location of cut he would need to be on antibiotics.   After doing the injection I recognized that I should not have used the epi  for the block. I called pharmacy and asked if I should do reversal and they recommend to just observe finger. I later explained to patient that I should not have used the epinephrine and that this can cause a small increased risk for discoloration of the finger and further finger injury.    I explained to pt I wanted to monitor the finger to ensure no decreased blood flow to finger.    After monitor there was no progression of discoloration just a slight tinge near the lac  however after doing some research it looked liked the best option would be to do phentolamine rescue to be safe although the data and some studies have debunked the old theory not to use epi for block I still wanted to be overly cautious.  I called [facility] and they worked on preparing the right dose and sending it to me. I also called Dr. Odis Luster ortho to see if he could f/u tomorrow with one of them/hand surgeon to monitor the finger and make sure nothing progressing.   Pt stepped out of room and asked for the bathroom and I explained to pt that Although their is no significant change in color of finger, I wanted to inject a  reversal agent to ensure blood flow is adequate to finger.  Pt expressed understanding to this. I told him I talked to an orthopedic doctor and wanted him to get follow up in clinic tomorrow to ensure that it was looking okay.    After explaining this all to the patient I went back into the room with the phentolamine now prepared and he was not found.  I attempted to call his cell phone.  Initially someone picked up but then they hung up on me without saying anything and the second time I called they did not pick up.    Pt at least should have gotten some antibiotics, hand surgeon number, and phentolamine rescue but I am unclear why he eloped after explaining this all to him.   We checked the bathroom and I have checked back into the room multiple times but patient has not been found and will place patient as an elopement         ____________________________________________   FINAL CLINICAL IMPRESSION(S) / ED DIAGNOSES   Final diagnoses:  Laceration of left index finger, foreign body presence unspecified, nail damage status unspecified, initial encounter      MEDICATIONS GIVEN DURING THIS VISIT:  Medications  phentolamine (REGITINE) injection 5 mg (1 mg Subcutaneous Not Given 06/18/21 2304)  lidocaine-EPINEPHrine (XYLOCAINE W/EPI) 2 %-1:100000 (with pres) injection 20 mL (20 mLs Other Given by Other 06/18/21 2050)  oxyCODONE (Oxy IR/ROXICODONE) immediate release tablet 5 mg (5 mg Oral Given 06/18/21 2129)  Tdap (BOOSTRIX) injection 0.5 mL (0.5 mLs Intramuscular Given 06/18/21 2129)     ED Discharge Orders          Ordered    cephALEXin (KEFLEX) 500 MG capsule  2 times daily        Pending             Note:  This document was prepared using Dragon voice recognition software and may include unintentional dictation errors.    Concha Se, MD 06/18/21 2354    Concha Se, MD 06/19/21 [phone]

## 2021-06-18 NOTE — ED Triage Notes (Signed)
Pt was cutting food and cut tip of left 2nd finger with knife. Partial avulsion noted to pad of finger.

## 2021-09-10 ENCOUNTER — Other Ambulatory Visit: Payer: Self-pay

## 2021-09-10 ENCOUNTER — Emergency Department: Payer: Self-pay

## 2021-09-10 ENCOUNTER — Emergency Department
Admission: EM | Admit: 2021-09-10 | Discharge: 2021-09-10 | Disposition: A | Discharge status: Home / Self Care | Payer: Self-pay | Attending: Emergency Medicine | Admitting: Emergency Medicine

## 2021-09-10 DIAGNOSIS — M25512 Pain in left shoulder: Secondary | ICD-10-CM | POA: Insufficient documentation

## 2021-09-10 DIAGNOSIS — F17210 Nicotine dependence, cigarettes, uncomplicated: Secondary | ICD-10-CM | POA: Insufficient documentation

## 2021-09-10 MED ORDER — METHOCARBAMOL 500 MG PO TABS
500.0000 mg | ORAL_TABLET | Freq: Once | ORAL | Status: AC
  Administered 2021-09-10: 500 mg via ORAL
  Filled 2021-09-10: qty 1

## 2021-09-10 MED ORDER — KETOROLAC TROMETHAMINE 30 MG/ML IJ SOLN
30.0000 mg | Freq: Once | INTRAMUSCULAR | Status: AC
  Administered 2021-09-10: 30 mg via INTRAMUSCULAR
  Filled 2021-09-10: qty 1

## 2021-09-10 MED ORDER — METHOCARBAMOL 500 MG PO TABS: 500.0000 mg | ORAL_TABLET | Freq: Four times a day (QID) | ORAL | 0 refills | Status: AC | PRN

## 2021-09-10 MED ORDER — NAPROXEN 500 MG PO TABS: 500.0000 mg | ORAL_TABLET | Freq: Two times a day (BID) | ORAL | 0 refills | Status: AC

## 2021-09-10 NOTE — ED Triage Notes (Signed)
Pt to ED for left shoulder pain x2 day, increased pain with movement. States thinks slept wrong \\NAD  noted

## 2021-09-10 NOTE — ED Notes (Signed)
See triage note  presents with pain to left shoulder  states pain started couple of days ago  unsure of injury  but states he did move furniture  no deformity noted  good pulses

## 2021-09-10 NOTE — Discharge Instructions (Addendum)
[facility] if any continued problems with your left shoulder.  This clinic is free but you will need to call to get information.  Also to obtain a primary care provider the phone numbers for [facility] and [facility] are on your discharge papers.  You may use ice or heat to your shoulder as needed for discomfort.  The first dose of medication was given to you while in the emergency department.  Methocarbamol and naproxen are the 2 medications.  The methocarbamol is the muscle relaxant and should not be taken while you are driving or operating machinery.  The naproxen you can take twice a day with food and this medication does not cause any drowsiness.  Also consider discontinuing smoking cigarettes.  There is a small change on your chest x-ray that has been there since 2017 which may be related to your smoking.

## 2021-09-10 NOTE — ED Provider Notes (Signed)
[facility] Provider Note    Event Date/Time   First MD Initiated Contact with Patient 09/10/21 [phone]     (approximate)   History   Shoulder Pain   HPI  Willie Lyons is a 26 y.o. male   presents to the ED with complaint of left shoulder pain for the last 2 days.  Patient denies any recent injury and states that pain is worse with movement.  He initially felt that he "slept on it wrong".  Patient also reports that he also experiences pain in the same area when he takes a deep breath.  Patient is a smoker, smoking approximately 5 to 6 cigarettes/day.  He denies any fever, chills, congestion, or cough.  Currently rates pain as 10/10.      Physical Exam   Triage Vital Signs: ED Triage Vitals  Enc Vitals Group     BP 09/10/21 0707 127/86     Pulse Rate 09/10/21 0707 100     Resp 09/10/21 0707 18     Temp 09/10/21 0707 98.5 F (36.9 C)     Temp Source 09/10/21 0707 Oral     SpO2 09/10/21 0707 94 %     Weight 09/10/21 0708 180 lb (81.6 kg)     Height 09/10/21 0708 6\' 1"  (1.854 m)     Head Circumference --      Peak Flow --      Pain Score 09/10/21 0708 10     Pain Loc --      Pain Edu? --      Excl. in GC? --     Most recent vital signs: Vitals:   09/10/21 0707 09/10/21 0935  BP: 127/86 120/82  Pulse: 100 84  Resp: 18 18  Temp: 98.5 F (36.9 C)   SpO2: 94% 96%     General: Awake, no distress.  Appears to be uncomfortable.   CV:  Good peripheral perfusion.  Heart regular rate and rhythm without murmur. Resp:  Normal effort.  Clear bilaterally. Abd:  No distention.  Soft, nontender Other:  On examination of the left shoulder there is no gross deformity and no crepitus with range of motion.  Passively there is no difficulty with range of motion.  There is some moderate tenderness on palpation of the left trapezius muscle and parascapular muscles.  No soft tissue edema or discoloration is present.  Patient would have discomfort with deep  inspirations and a chest x-ray was obtained to confirm that he does not have pneumonia or a pneumothorax producing his pain.  Patient was encouraged to obtain a PCP as he does have a small pleural effusion which is unchanged from 2017.   ED Results / Procedures / Treatments   Labs (all labs ordered are listed, but only abnormal results are displayed) Labs Reviewed - No data to display    RADIOLOGY  Left shoulder x-ray images were reviewed by myself and negative for any acute bony injury.  Radiology report was read and confirmed no acute bony changes.  Chest x-ray images were reviewed and no pneumonia or pneumothorax present.  Radiology report mentions a very small pleural effusion which is unchanged since x-ray from 2017.  PROCEDURES:  Critical Care performed: No  Procedures   MEDICATIONS ORDERED IN ED: Medications  ketorolac (TORADOL) 30 MG/ML injection 30 mg (30 mg Intramuscular Given 09/10/21 1016)  methocarbamol (ROBAXIN) tablet 500 mg (500 mg Oral Given 09/10/21 1016)     IMPRESSION / MDM / ASSESSMENT  AND PLAN / ED COURSE  I reviewed the triage vital signs and the nursing notes.                              Differential diagnosis includes, but is not limited to, left shoulder pain, injury, muscle strain, degenerative joint disease, shoulder pain due to pneumonia or pneumothorax.  26 year old male presents to the ED with complaint of left shoulder pain for 2 days without known history of injury.  Patient states pain is increased with movement.  Physical exam was positive for left trapezius muscle and parascapular muscle tenderness. Patient would have discomfort with deep inspirations and a chest x-ray was obtained to confirm that he does not have pneumonia or a pneumothorax producing his pain.  Patient was encouraged to obtain a PCP as he does have a small pleural effusion which is unchanged from 2017.  Patient was given an injection of Toradol 30 mg IM and methocarbamol 500  mg p.o. as he was not driving.  A prescription for methocarbamol and naproxen was sent to his pharmacy.  He is encouraged to use ice or heat to his shoulder as needed for discomfort and to follow-up with the [facility] or one of the clinics listed on his discharge papers as he will need a primary care for follow-up of his chest x-ray.  He is also strongly encouraged to discontinue smoking.    FINAL CLINICAL IMPRESSION(S) / ED DIAGNOSES   Final diagnoses:  Acute pain of left shoulder     Rx / DC Orders   ED Discharge Orders          Ordered    methocarbamol (ROBAXIN) 500 MG tablet  Every 6 hours PRN        09/10/21 1013    naproxen (NAPROSYN) 500 MG tablet  2 times daily with meals        09/10/21 1013             Note:  This document was prepared using Dragon voice recognition software and may include unintentional dictation errors.   Tommi Rumps, PA-C 09/10/21 1537    Delton Prairie, MD 09/11/21 1600
